# Patient Record
Sex: Female | Born: 1945 | Race: Black or African American | Hispanic: No | Marital: Married
Health system: Southern US, Community
[De-identification: ages and names within clinical notes are randomized; demographics above are authoritative.]

## PROBLEM LIST (undated history)

## (undated) HISTORY — PX: OTHER SURGICAL HISTORY: SHX169

## (undated) HISTORY — PX: BREAST SURGERY: SHX581

## (undated) HISTORY — PX: COLONOSCOPY: SHX174

---

## 2016-01-17 ENCOUNTER — Other Ambulatory Visit: Payer: Self-pay | Admitting: Family Medicine

## 2016-01-17 DIAGNOSIS — R5381 Other malaise: Secondary | ICD-10-CM

## 2016-01-24 ENCOUNTER — Other Ambulatory Visit: Payer: Self-pay | Admitting: Family Medicine

## 2016-01-24 DIAGNOSIS — R5381 Other malaise: Secondary | ICD-10-CM

## 2016-02-05 ENCOUNTER — Ambulatory Visit
Admission: RE | Admit: 2016-02-05 | Discharge: 2016-02-05 | Disposition: A | Discharge status: Home / Self Care | Payer: Medicare Other | Source: Ambulatory Visit | Attending: Family Medicine | Admitting: Family Medicine

## 2016-02-05 ENCOUNTER — Other Ambulatory Visit: Payer: Self-pay | Admitting: Family Medicine

## 2016-02-05 DIAGNOSIS — R5381 Other malaise: Secondary | ICD-10-CM

## 2016-02-05 DIAGNOSIS — N6310 Unspecified lump in the right breast, unspecified quadrant: Secondary | ICD-10-CM

## 2016-02-06 ENCOUNTER — Other Ambulatory Visit: Payer: Self-pay | Admitting: Family Medicine

## 2016-02-06 ENCOUNTER — Other Ambulatory Visit: Payer: Self-pay | Admitting: *Deleted

## 2016-02-06 DIAGNOSIS — N6320 Unspecified lump in the left breast, unspecified quadrant: Secondary | ICD-10-CM

## 2016-02-06 DIAGNOSIS — N6310 Unspecified lump in the right breast, unspecified quadrant: Secondary | ICD-10-CM

## 2016-02-13 ENCOUNTER — Other Ambulatory Visit: Payer: No Typology Code available for payment source

## 2016-02-14 ENCOUNTER — Other Ambulatory Visit: Payer: Self-pay | Admitting: Family Medicine

## 2016-02-14 DIAGNOSIS — N6320 Unspecified lump in the left breast, unspecified quadrant: Secondary | ICD-10-CM

## 2016-02-14 DIAGNOSIS — N6310 Unspecified lump in the right breast, unspecified quadrant: Secondary | ICD-10-CM

## 2016-02-15 ENCOUNTER — Ambulatory Visit
Admission: RE | Admit: 2016-02-15 | Discharge: 2016-02-15 | Disposition: A | Discharge status: Home / Self Care | Payer: Medicare Other | Source: Ambulatory Visit | Attending: Family Medicine | Admitting: Family Medicine

## 2016-02-15 DIAGNOSIS — N6310 Unspecified lump in the right breast, unspecified quadrant: Secondary | ICD-10-CM

## 2016-02-15 DIAGNOSIS — N6320 Unspecified lump in the left breast, unspecified quadrant: Secondary | ICD-10-CM

## 2018-10-20 ENCOUNTER — Emergency Department (HOSPITAL_COMMUNITY): Payer: Medicare Other

## 2018-10-20 ENCOUNTER — Encounter (HOSPITAL_COMMUNITY): Payer: Self-pay | Admitting: Emergency Medicine

## 2018-10-20 ENCOUNTER — Other Ambulatory Visit: Payer: Self-pay

## 2018-10-20 ENCOUNTER — Inpatient Hospital Stay (HOSPITAL_COMMUNITY)
Admission: EM | Admit: 2018-10-20 | Discharge: 2018-10-22 | DRG: 185 | Disposition: A | Discharge status: Home / Self Care | Payer: Medicare Other | Attending: General Surgery | Admitting: General Surgery

## 2018-10-20 DIAGNOSIS — S8001XA Contusion of right knee, initial encounter: Secondary | ICD-10-CM | POA: Diagnosis present

## 2018-10-20 DIAGNOSIS — S2249XA Multiple fractures of ribs, unspecified side, initial encounter for closed fracture: Secondary | ICD-10-CM | POA: Diagnosis present

## 2018-10-20 DIAGNOSIS — S2239XA Fracture of one rib, unspecified side, initial encounter for closed fracture: Secondary | ICD-10-CM | POA: Diagnosis present

## 2018-10-20 DIAGNOSIS — Y92410 Unspecified street and highway as the place of occurrence of the external cause: Secondary | ICD-10-CM | POA: Diagnosis not present

## 2018-10-20 DIAGNOSIS — Z23 Encounter for immunization: Secondary | ICD-10-CM

## 2018-10-20 DIAGNOSIS — S5011XA Contusion of right forearm, initial encounter: Secondary | ICD-10-CM | POA: Diagnosis present

## 2018-10-20 DIAGNOSIS — M79661 Pain in right lower leg: Secondary | ICD-10-CM | POA: Diagnosis present

## 2018-10-20 DIAGNOSIS — S80211A Abrasion, right knee, initial encounter: Secondary | ICD-10-CM | POA: Diagnosis present

## 2018-10-20 DIAGNOSIS — Z20828 Contact with and (suspected) exposure to other viral communicable diseases: Secondary | ICD-10-CM | POA: Diagnosis present

## 2018-10-20 DIAGNOSIS — S2241XA Multiple fractures of ribs, right side, initial encounter for closed fracture: Secondary | ICD-10-CM | POA: Diagnosis present

## 2018-10-20 LAB — URINALYSIS, ROUTINE W REFLEX MICROSCOPIC
Bilirubin Urine: NEGATIVE
Glucose, UA: NEGATIVE mg/dL
Hgb urine dipstick: NEGATIVE
Ketones, ur: NEGATIVE mg/dL
Leukocytes,Ua: NEGATIVE
Nitrite: NEGATIVE
Protein, ur: NEGATIVE mg/dL
Specific Gravity, Urine: 1.043 — ABNORMAL HIGH (ref 1.005–1.030)
pH: 5 (ref 5.0–8.0)

## 2018-10-20 LAB — I-STAT CHEM 8, ED
BUN: 17 mg/dL (ref 8–23)
Calcium, Ion: 0.9 mmol/L — ABNORMAL LOW (ref 1.15–1.40)
Chloride: 111 mmol/L (ref 98–111)
Creatinine, Ser: 0.8 mg/dL (ref 0.44–1.00)
Glucose, Bld: 161 mg/dL — ABNORMAL HIGH (ref 70–99)
HCT: 41 % (ref 36.0–46.0)
Hemoglobin: 13.9 g/dL (ref 12.0–15.0)
Potassium: 4.7 mmol/L (ref 3.5–5.1)
Sodium: 139 mmol/L (ref 135–145)
TCO2: 23 mmol/L (ref 22–32)

## 2018-10-20 LAB — CBC
HCT: 41.9 % (ref 36.0–46.0)
Hemoglobin: 13.1 g/dL (ref 12.0–15.0)
MCH: 28.3 pg (ref 26.0–34.0)
MCHC: 31.3 g/dL (ref 30.0–36.0)
MCV: 90.5 fL (ref 80.0–100.0)
Platelets: 169 10*3/uL (ref 150–400)
RBC: 4.63 MIL/uL (ref 3.87–5.11)
RDW: 15 % (ref 11.5–15.5)
WBC: 11.2 10*3/uL — ABNORMAL HIGH (ref 4.0–10.5)
nRBC: 0 % (ref 0.0–0.2)

## 2018-10-20 LAB — ETHANOL: Alcohol, Ethyl (B): <10 mg/dL (ref <10)

## 2018-10-20 LAB — COMPREHENSIVE METABOLIC PANEL
ALT: 19 U/L (ref 0–44)
AST: 22 U/L (ref 15–41)
Albumin: 3.6 g/dL (ref 3.5–5.0)
Alkaline Phosphatase: 60 U/L (ref 38–126)
Anion gap: 12 (ref 5–15)
BUN: 12 mg/dL (ref 8–23)
CO2: 20 mmol/L — ABNORMAL LOW (ref 22–32)
Calcium: 8.6 mg/dL — ABNORMAL LOW (ref 8.9–10.3)
Chloride: 109 mmol/L (ref 98–111)
Creatinine, Ser: 0.9 mg/dL (ref 0.44–1.00)
GFR calc Af Amer: >60 mL/min (ref >60)
GFR calc non Af Amer: >60 mL/min (ref >60)
Glucose, Bld: 168 mg/dL — ABNORMAL HIGH (ref 70–99)
Potassium: 3.8 mmol/L (ref 3.5–5.1)
Sodium: 141 mmol/L (ref 135–145)
Total Bilirubin: 1.5 mg/dL — ABNORMAL HIGH (ref 0.3–1.2)
Total Protein: 7 g/dL (ref 6.5–8.1)

## 2018-10-20 LAB — MRSA PCR SCREENING: MRSA by PCR: NEGATIVE

## 2018-10-20 LAB — CDS SEROLOGY

## 2018-10-20 LAB — LACTIC ACID, PLASMA
Lactic Acid, Venous: 2.3 mmol/L (ref 0.5–1.9)
Lactic Acid, Venous: 1.8 mmol/L (ref 0.5–1.9)

## 2018-10-20 LAB — SAMPLE TO BLOOD BANK

## 2018-10-20 LAB — PROTIME-INR
INR: 1 (ref 0.8–1.2)
Prothrombin Time: 12.6 seconds (ref 11.4–15.2)

## 2018-10-20 MED ORDER — TETANUS-DIPHTH-ACELL PERTUSSIS 5-2.5-18.5 LF-MCG/0.5 IM SUSP
0.5000 mL | Freq: Once | INTRAMUSCULAR | Status: AC
  Administered 2018-10-20: 15:00:00 0.5 mL via INTRAMUSCULAR
  Filled 2018-10-20: qty 0.5

## 2018-10-20 MED ORDER — SODIUM CHLORIDE 0.9 % IV BOLUS
1000.0000 mL | Freq: Once | INTRAVENOUS | Status: AC
  Administered 2018-10-20: 1000 mL via INTRAVENOUS

## 2018-10-20 MED ORDER — MORPHINE SULFATE (PF) 2 MG/ML IV SOLN
2.0000 mg | INTRAVENOUS | Status: DC | PRN
  Administered 2018-10-20: 2 mg via INTRAVENOUS
  Filled 2018-10-20: qty 1

## 2018-10-20 MED ORDER — IOHEXOL 300 MG/ML  SOLN
100.0000 mL | Freq: Once | INTRAMUSCULAR | Status: AC | PRN
  Administered 2018-10-20: 15:00:00 100 mL via INTRAVENOUS

## 2018-10-20 MED ORDER — DOCUSATE SODIUM 100 MG PO CAPS
100.0000 mg | ORAL_CAPSULE | Freq: Two times a day (BID) | ORAL | Status: DC
  Administered 2018-10-20 – 2018-10-22 (×4): 100 mg via ORAL
  Filled 2018-10-20 (×4): qty 1

## 2018-10-20 MED ORDER — IBUPROFEN 200 MG PO TABS
800.0000 mg | ORAL_TABLET | Freq: Three times a day (TID) | ORAL | Status: DC | PRN
  Administered 2018-10-20 – 2018-10-21 (×2): 800 mg via ORAL
  Filled 2018-10-20 (×2): qty 4

## 2018-10-20 MED ORDER — ENOXAPARIN SODIUM 40 MG/0.4ML ~~LOC~~ SOLN
40.0000 mg | SUBCUTANEOUS | Status: DC
  Administered 2018-10-20 – 2018-10-21 (×2): 40 mg via SUBCUTANEOUS
  Filled 2018-10-20 (×2): qty 0.4

## 2018-10-20 MED ORDER — ONDANSETRON HCL 4 MG/2ML IJ SOLN
4.0000 mg | Freq: Once | INTRAMUSCULAR | Status: AC
  Administered 2018-10-20: 15:00:00 4 mg via INTRAVENOUS
  Filled 2018-10-20: qty 2

## 2018-10-20 MED ORDER — ACETAMINOPHEN 325 MG PO TABS: 650.0000 mg | ORAL_TABLET | ORAL | Status: DC | PRN

## 2018-10-20 MED ORDER — FENTANYL CITRATE (PF) 100 MCG/2ML IJ SOLN
100.0000 ug | Freq: Once | INTRAMUSCULAR | Status: AC
  Administered 2018-10-20: 15:00:00 100 ug via INTRAVENOUS
  Filled 2018-10-20: qty 2
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
## 2018-10-21 ENCOUNTER — Inpatient Hospital Stay (HOSPITAL_COMMUNITY): Payer: Medicare Other

## 2018-10-21 LAB — CBC
HCT: 39.7 % (ref 36.0–46.0)
Hemoglobin: 12.5 g/dL (ref 12.0–15.0)
MCH: 27.7 pg (ref 26.0–34.0)
MCHC: 31.5 g/dL (ref 30.0–36.0)
MCV: 88 fL (ref 80.0–100.0)
Platelets: 132 10*3/uL — ABNORMAL LOW (ref 150–400)
RBC: 4.51 MIL/uL (ref 3.87–5.11)
RDW: 15 % (ref 11.5–15.5)
WBC: 4.7 10*3/uL (ref 4.0–10.5)
nRBC: 0 % (ref 0.0–0.2)

## 2018-10-21 LAB — BASIC METABOLIC PANEL
Anion gap: 10 (ref 5–15)
BUN: 12 mg/dL (ref 8–23)
CO2: 23 mmol/L (ref 22–32)
Calcium: 8.3 mg/dL — ABNORMAL LOW (ref 8.9–10.3)
Chloride: 107 mmol/L (ref 98–111)
Creatinine, Ser: 0.85 mg/dL (ref 0.44–1.00)
GFR calc Af Amer: >60 mL/min (ref >60)
GFR calc non Af Amer: >60 mL/min (ref >60)
Glucose, Bld: 135 mg/dL — ABNORMAL HIGH (ref 70–99)
Potassium: 3.8 mmol/L (ref 3.5–5.1)
Sodium: 140 mmol/L (ref 135–145)

## 2018-10-21 LAB — SARS CORONAVIRUS 2 (TAT 6-24 HRS): SARS Coronavirus 2: NEGATIVE

## 2018-10-21 MED ORDER — METHOCARBAMOL 500 MG PO TABS: 500.0000 mg | ORAL_TABLET | Freq: Three times a day (TID) | ORAL | Status: DC | PRN

## 2018-10-21 MED ORDER — ACETAMINOPHEN 325 MG PO TABS
650.0000 mg | ORAL_TABLET | ORAL | Status: DC
  Administered 2018-10-21 – 2018-10-22 (×5): 650 mg via ORAL
  Filled 2018-10-21 (×5): qty 2

## 2018-10-21 MED ORDER — BACITRACIN ZINC 500 UNIT/GM EX OINT
TOPICAL_OINTMENT | Freq: Two times a day (BID) | CUTANEOUS | Status: DC
  Administered 2018-10-21: 1 via TOPICAL
  Administered 2018-10-22: 09:00:00 via TOPICAL
  Filled 2018-10-21: qty 28.4

## 2018-10-21 MED ORDER — OXYCODONE HCL 5 MG PO TABS: 5.0000 mg | ORAL_TABLET | ORAL | Status: DC | PRN
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
## 2018-10-21 NOTE — Progress Notes (Signed)
Visited patient for Spiritual Care Consult. Patient requested prayer for strength and healing. Patient is very spiritually resilient and leans on personal faith. Will continue to provide spiritual care as needed.  Rev. [place].

## 2018-10-22 MED ORDER — OXYCODONE HCL 5 MG PO TABS: 5.0000 mg | ORAL_TABLET | ORAL | 0 refills | Status: AC | PRN

## 2018-10-22 MED ORDER — IBUPROFEN 800 MG PO TABS: 800.0000 mg | ORAL_TABLET | Freq: Three times a day (TID) | ORAL | 0 refills | Status: AC | PRN

## 2018-10-22 MED ORDER — ACETAMINOPHEN 325 MG PO TABS: 650.0000 mg | ORAL_TABLET | ORAL | Status: AC

## 2018-10-22 NOTE — Progress Notes (Signed)
Discharge paperwork and instructions given to pt. Pt is not in distress and tolerated well.

## 2018-10-22 NOTE — Care Management Important Message (Signed)
Important Message  Patient Details  Name: Paige Ruiz MRN: [id number] Date of Birth: Oct 12, 1945   Medicare Important Message Given:  Yes     Orbie Pyo 10/22/2018, 2:57 PM

## 2018-10-22 NOTE — Plan of Care (Signed)
  Problem: Pain Managment: Goal: General experience of comfort will improve Outcome: Progressing   Problem: Safety: Goal: Ability to remain free from injury will improve Outcome: Progressing   
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
## 2018-10-22 NOTE — Discharge Instructions (Signed)
Rib Fracture ° °A rib fracture is a break or crack in one of the bones of the ribs. The ribs are like a cage that goes around your upper chest. A broken or cracked rib is often painful, but most do not cause other problems. Most rib fractures usually heal on their own in 1-3 months. °Follow these instructions at home: °Managing pain, stiffness, and swelling °· If directed, apply ice to the injured area. °? Put ice in a plastic bag. °? Place a towel between your skin and the bag. °? Leave the ice on for 20 minutes, 2-3 times a day. °· Take over-the-counter and prescription medicines only as told by your doctor. °Activity °· Avoid activities that cause pain to the injured area. Protect your injured area. °· Slowly increase activity as told by your doctor. °General instructions °· Do deep breathing as told by your doctor. You may be told to: °? Take deep breaths many times a day. °? Cough many times a day while hugging a pillow. °? Use a device (incentive spirometer) to do deep breathing many times a day. °· Drink enough fluid to keep your pee (urine) clear or pale yellow. °· Do not wear a rib belt or binder. These do not allow you to breathe deeply. °· Keep all follow-up visits as told by your doctor. This is important. °Contact a doctor if: °· You have a fever. °Get help right away if: °· You have trouble breathing. °· You are short of breath. °· You cannot stop coughing. °· You cough up thick or bloody spit (sputum). °· You feel sick to your stomach (nauseous), throw up (vomit), or have belly (abdominal) pain. °· Your pain gets worse and medicine does not help. °Summary °· A rib fracture is a break or crack in one of the bones of the ribs. °· Apply ice to the injured area and take medicines for pain as told by your doctor. °· Take deep breaths and cough many times a day. Hug a pillow every time you cough. °This information is not intended to replace advice given to you by your health care provider. Make sure you  discuss any questions you have with your health care provider. °Document Released: 10/09/2007 Document Revised: 12/12/2016 Document Reviewed: 04/01/2016 °Elsevier Patient Education © 2020 [employer] ° °
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
## 2018-10-26 ENCOUNTER — Other Ambulatory Visit: Payer: Self-pay

## 2018-10-26 ENCOUNTER — Encounter (HOSPITAL_COMMUNITY): Payer: Self-pay | Admitting: Emergency Medicine

## 2018-10-26 ENCOUNTER — Emergency Department (HOSPITAL_COMMUNITY)
Admission: EM | Admit: 2018-10-26 | Discharge: 2018-10-27 | Disposition: A | Discharge status: Home / Self Care | Payer: Medicare Other | Attending: Emergency Medicine | Admitting: Emergency Medicine

## 2018-10-26 DIAGNOSIS — Z79899 Other long term (current) drug therapy: Secondary | ICD-10-CM | POA: Insufficient documentation

## 2018-10-26 DIAGNOSIS — R58 Hemorrhage, not elsewhere classified: Secondary | ICD-10-CM | POA: Insufficient documentation

## 2018-10-26 DIAGNOSIS — R2241 Localized swelling, mass and lump, right lower limb: Secondary | ICD-10-CM | POA: Diagnosis present

## 2018-10-26 DIAGNOSIS — Y929 Unspecified place or not applicable: Secondary | ICD-10-CM | POA: Insufficient documentation

## 2018-10-26 DIAGNOSIS — S8981XD Other specified injuries of right lower leg, subsequent encounter: Secondary | ICD-10-CM | POA: Diagnosis not present

## 2018-10-26 DIAGNOSIS — R52 Pain, unspecified: Secondary | ICD-10-CM

## 2018-10-26 DIAGNOSIS — Y999 Unspecified external cause status: Secondary | ICD-10-CM | POA: Diagnosis not present

## 2018-10-26 DIAGNOSIS — M79604 Pain in right leg: Secondary | ICD-10-CM | POA: Diagnosis not present

## 2018-10-26 DIAGNOSIS — Y939 Activity, unspecified: Secondary | ICD-10-CM | POA: Insufficient documentation

## 2018-10-26 DIAGNOSIS — S70321A Blister (nonthermal), right thigh, initial encounter: Secondary | ICD-10-CM | POA: Diagnosis not present

## 2018-10-26 LAB — CBC WITH DIFFERENTIAL/PLATELET
Abs Immature Granulocytes: 0.04 10*3/uL (ref 0.00–0.07)
Basophils Absolute: 0 10*3/uL (ref 0.0–0.1)
Basophils Relative: 0 %
Eosinophils Absolute: 0.1 10*3/uL (ref 0.0–0.5)
Eosinophils Relative: 1 %
HCT: 35 % — ABNORMAL LOW (ref 36.0–46.0)
Hemoglobin: 11.3 g/dL — ABNORMAL LOW (ref 12.0–15.0)
Immature Granulocytes: 1 %
Lymphocytes Relative: 16 %
Lymphs Abs: 1.2 10*3/uL (ref 0.7–4.0)
MCH: 28.7 pg (ref 26.0–34.0)
MCHC: 32.3 g/dL (ref 30.0–36.0)
MCV: 88.8 fL (ref 80.0–100.0)
Monocytes Absolute: 0.6 10*3/uL (ref 0.1–1.0)
Monocytes Relative: 9 %
Neutro Abs: 5.4 10*3/uL (ref 1.7–7.7)
Neutrophils Relative %: 73 %
Platelets: 204 10*3/uL (ref 150–400)
RBC: 3.94 MIL/uL (ref 3.87–5.11)
RDW: 15.1 % (ref 11.5–15.5)
WBC: 7.4 10*3/uL (ref 4.0–10.5)
nRBC: 0 % (ref 0.0–0.2)

## 2018-10-26 LAB — COMPREHENSIVE METABOLIC PANEL
ALT: 32 U/L (ref 0–44)
AST: 24 U/L (ref 15–41)
Albumin: 3.9 g/dL (ref 3.5–5.0)
Alkaline Phosphatase: 77 U/L (ref 38–126)
Anion gap: 13 (ref 5–15)
BUN: 13 mg/dL (ref 8–23)
CO2: 20 mmol/L — ABNORMAL LOW (ref 22–32)
Calcium: 9.1 mg/dL (ref 8.9–10.3)
Chloride: 107 mmol/L (ref 98–111)
Creatinine, Ser: 0.85 mg/dL (ref 0.44–1.00)
GFR calc Af Amer: >60 mL/min (ref >60)
GFR calc non Af Amer: >60 mL/min (ref >60)
Glucose, Bld: 112 mg/dL — ABNORMAL HIGH (ref 70–99)
Potassium: 3.5 mmol/L (ref 3.5–5.1)
Sodium: 140 mmol/L (ref 135–145)
Total Bilirubin: 2.6 mg/dL — ABNORMAL HIGH (ref 0.3–1.2)
Total Protein: 7.6 g/dL (ref 6.5–8.1)

## 2018-10-26 LAB — PROTIME-INR
INR: 1.1 (ref 0.8–1.2)
Prothrombin Time: 14.4 seconds (ref 11.4–15.2)

## 2018-10-26 NOTE — ED Triage Notes (Signed)
Patient reports worsening right leg swelling " feels tight" with skin blisters at upper thigh this week worse today , denies injury , seen at a local urgent care today sent here for further evaluation of DVT , respirations unlabored, faint pedal pulses. Denies fever or chills .

## 2018-10-27 ENCOUNTER — Emergency Department (HOSPITAL_COMMUNITY): Payer: Medicare Other

## 2018-10-27 ENCOUNTER — Emergency Department (HOSPITAL_BASED_OUTPATIENT_CLINIC_OR_DEPARTMENT_OTHER): Payer: Medicare Other

## 2018-10-27 DIAGNOSIS — M7989 Other specified soft tissue disorders: Secondary | ICD-10-CM | POA: Diagnosis not present

## 2018-10-27 DIAGNOSIS — M79609 Pain in unspecified limb: Secondary | ICD-10-CM

## 2018-10-27 DIAGNOSIS — M79604 Pain in right leg: Secondary | ICD-10-CM | POA: Diagnosis not present

## 2018-10-27 MED ORDER — ACETAMINOPHEN 325 MG PO TABS
650.0000 mg | ORAL_TABLET | Freq: Once | ORAL | Status: AC
  Administered 2018-10-27: 07:00:00 650 mg via ORAL
  Filled 2018-10-27: qty 2

## 2018-10-27 MED ORDER — SILVER SULFADIAZINE 1 % EX CREA: 1.0000 "application " | TOPICAL_CREAM | Freq: Every day | CUTANEOUS | 0 refills | Status: AC

## 2018-10-27 MED ORDER — IOHEXOL 300 MG/ML  SOLN
100.0000 mL | Freq: Once | INTRAMUSCULAR | Status: AC | PRN
  Administered 2018-10-27: 100 mL via INTRAVENOUS

## 2018-10-27 MED ORDER — SILVER SULFADIAZINE 1 % EX CREA
TOPICAL_CREAM | Freq: Once | CUTANEOUS | Status: AC
  Administered 2018-10-27: 16:00:00 via TOPICAL
  Filled 2018-10-27: qty 85

## 2018-10-27 NOTE — Discharge Instructions (Addendum)
Please follow up with Dr. Doreatha Martin in the office Wear the ACE wrap to help with swelling and elevate the leg. You can obtain compression stockings (TED hose) over the counter. Apply Silvadene cream to the wounds to help with healing. Apply a thin layer and cover with a bandage. Wash the wounds with soap and water daily. Please follow up with your primary doctor for a wound check in 1-2 weeks Continue your pain medicine as prescribed Return if worsening
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
## 2018-10-27 NOTE — ED Notes (Signed)
Vascular Tech @ bedside.

## 2018-10-27 NOTE — ED Notes (Signed)
Patient transported to CT 
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
## 2018-10-27 NOTE — Progress Notes (Signed)
Right lower ext venous  has been completed. Refer to [facility] under chart review to view preliminary results.   10/27/2018  10:03 AM Keirsten Matuska, Bonnye Fava
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
## 2018-10-28 ENCOUNTER — Encounter: Payer: Self-pay | Admitting: Student

## 2018-10-28 DIAGNOSIS — T148XXA Other injury of unspecified body region, initial encounter: Secondary | ICD-10-CM | POA: Insufficient documentation

## 2018-11-23 ENCOUNTER — Other Ambulatory Visit (HOSPITAL_COMMUNITY): Payer: Self-pay | Admitting: Student

## 2018-11-23 ENCOUNTER — Other Ambulatory Visit: Payer: Self-pay | Admitting: Student

## 2018-11-23 ENCOUNTER — Other Ambulatory Visit: Payer: Self-pay

## 2018-11-23 ENCOUNTER — Ambulatory Visit (HOSPITAL_COMMUNITY)
Admission: RE | Admit: 2018-11-23 | Discharge: 2018-11-23 | Disposition: A | Discharge status: Home / Self Care | Payer: Medicare Other | Source: Ambulatory Visit | Attending: Family Medicine | Admitting: Family Medicine

## 2018-11-23 DIAGNOSIS — M79604 Pain in right leg: Secondary | ICD-10-CM | POA: Diagnosis not present

## 2018-11-23 DIAGNOSIS — M7989 Other specified soft tissue disorders: Secondary | ICD-10-CM

## 2018-11-23 NOTE — Progress Notes (Signed)
Right lower extremity venous duplex has been completed. Preliminary results can be found in CV Proc through chart review.  Results were given to Dr. Doreatha Martin.  11/23/18 1:25 PM Paige Ruiz RVT

## 2019-01-04 ENCOUNTER — Other Ambulatory Visit (HOSPITAL_COMMUNITY): Payer: Self-pay | Admitting: Student

## 2019-01-04 ENCOUNTER — Ambulatory Visit (HOSPITAL_COMMUNITY)
Admission: RE | Admit: 2019-01-04 | Discharge: 2019-01-04 | Disposition: A | Discharge status: Home / Self Care | Payer: Medicare Other | Source: Ambulatory Visit | Attending: Student | Admitting: Student

## 2019-01-04 ENCOUNTER — Other Ambulatory Visit: Payer: Self-pay

## 2019-01-04 DIAGNOSIS — M7989 Other specified soft tissue disorders: Secondary | ICD-10-CM

## 2019-01-04 DIAGNOSIS — M79604 Pain in right leg: Secondary | ICD-10-CM | POA: Diagnosis present

## 2019-01-04 NOTE — Progress Notes (Signed)
Lower extremity venous has been completed.   Preliminary results in CV Proc.   Paige Ruiz 01/04/2019 2:00 PM

## 2019-03-13 ENCOUNTER — Emergency Department (HOSPITAL_COMMUNITY)
Admission: EM | Admit: 2019-03-13 | Discharge: 2019-03-13 | Disposition: A | Discharge status: Home / Self Care | Payer: Medicare Other | Attending: Emergency Medicine | Admitting: Emergency Medicine

## 2019-03-13 ENCOUNTER — Encounter (HOSPITAL_COMMUNITY): Payer: Self-pay

## 2019-03-13 ENCOUNTER — Other Ambulatory Visit: Payer: Self-pay

## 2019-03-13 DIAGNOSIS — S71101D Unspecified open wound, right thigh, subsequent encounter: Secondary | ICD-10-CM | POA: Insufficient documentation

## 2019-03-13 DIAGNOSIS — D649 Anemia, unspecified: Secondary | ICD-10-CM | POA: Insufficient documentation

## 2019-03-13 DIAGNOSIS — Z5189 Encounter for other specified aftercare: Secondary | ICD-10-CM | POA: Diagnosis not present

## 2019-03-13 DIAGNOSIS — X58XXXD Exposure to other specified factors, subsequent encounter: Secondary | ICD-10-CM | POA: Diagnosis not present

## 2019-03-13 LAB — CBC WITH DIFFERENTIAL/PLATELET
Abs Immature Granulocytes: 0.06 10*3/uL (ref 0.00–0.07)
Basophils Absolute: 0 10*3/uL (ref 0.0–0.1)
Basophils Relative: 0 %
Eosinophils Absolute: 0 10*3/uL (ref 0.0–0.5)
Eosinophils Relative: 0 %
HCT: 31.5 % — ABNORMAL LOW (ref 36.0–46.0)
Hemoglobin: 9.8 g/dL — ABNORMAL LOW (ref 12.0–15.0)
Immature Granulocytes: 1 %
Lymphocytes Relative: 10 %
Lymphs Abs: 1 10*3/uL (ref 0.7–4.0)
MCH: 25.4 pg — ABNORMAL LOW (ref 26.0–34.0)
MCHC: 31.1 g/dL (ref 30.0–36.0)
MCV: 81.6 fL (ref 80.0–100.0)
Monocytes Absolute: 1.1 10*3/uL — ABNORMAL HIGH (ref 0.1–1.0)
Monocytes Relative: 12 %
Neutro Abs: 7.5 10*3/uL (ref 1.7–7.7)
Neutrophils Relative %: 77 %
Platelets: 315 10*3/uL (ref 150–400)
RBC: 3.86 MIL/uL — ABNORMAL LOW (ref 3.87–5.11)
RDW: 15.9 % — ABNORMAL HIGH (ref 11.5–15.5)
WBC: 9.8 10*3/uL (ref 4.0–10.5)
nRBC: 0 % (ref 0.0–0.2)

## 2019-03-13 LAB — BASIC METABOLIC PANEL
Anion gap: 14 (ref 5–15)
BUN: 15 mg/dL (ref 8–23)
CO2: 20 mmol/L — ABNORMAL LOW (ref 22–32)
Calcium: 8.4 mg/dL — ABNORMAL LOW (ref 8.9–10.3)
Chloride: 102 mmol/L (ref 98–111)
Creatinine, Ser: 0.95 mg/dL (ref 0.44–1.00)
GFR calc Af Amer: >60 mL/min (ref >60)
GFR calc non Af Amer: 59 mL/min — ABNORMAL LOW (ref >60)
Glucose, Bld: 101 mg/dL — ABNORMAL HIGH (ref 70–99)
Potassium: 3.9 mmol/L (ref 3.5–5.1)
Sodium: 136 mmol/L (ref 135–145)

## 2019-03-13 NOTE — ED Provider Notes (Signed)
[facility] EMERGENCY DEPARTMENT Provider Note   CSN: [id number] Arrival date & time: 03/13/19  1854     History Chief Complaint  Patient presents with  . Wound Check  . Leg Swelling    Paige Ruiz is a 74 y.o. female with a past medical history of colon polyps, hyperlipidemia, Paige Ruiz lesion of the right thigh, who presents today for evaluation of bleeding from her thigh wound. She reports that she is followed by the wound care clinic in [facility] and that when she saw them on Wednesday the performed debridement of the overlying scab and tissue and since then she has noticed occasional bleeding.  Today when she was washing the wound however she states that it began squirting a thin string of blood across the room causing her to be concerned and called 911.  She states that there is some redness inside the wound itself, however no significant new redness around the wound.  She denies any change in her pain.  She states that the wound looks similar to how it has looked.  No fevers at home.    She denies any concerns today other than the bleeding.  She briefly attempted pressure prior to calling 911.  She states she is not currently taking any anticoagulation medicines.  HPI     History reviewed. No pertinent past medical history.  Patient Active Problem List   Diagnosis Date Noted  . Norma Fredrickson lesion 10/28/2018  . Rib fractures 10/20/2018    Past Surgical History:  Procedure Laterality Date  . BREAST SURGERY    . COLONOSCOPY    . eye sty       OB History   No obstetric history on file.     No family history on file.  Social History   Tobacco Use  . Smoking status: Never Smoker  . Smokeless tobacco: Never Used  Substance Use Topics  . Alcohol use: Yes    Comment: occ  . Drug use: Never    Home Medications Prior to Admission medications   Medication Sig Start Date End Date Taking? Authorizing Provider  DAKINS EX Apply 1  application topically daily. To cleanse wound on right thigh   Yes [provider]  Multiple Vitamin (MULTIVITAMIN WITH MINERALS) TABS tablet Take 1 tablet by mouth daily.   Yes [provider]  acetaminophen (TYLENOL) 325 MG tablet Take 2 tablets (650 mg total) by mouth every 4 (four) hours. Patient not taking: Reported on 03/13/2019 10/22/18   Barnetta Chapel, PA-C  ibuprofen (ADVIL) 800 MG tablet Take 1 tablet (800 mg total) by mouth every 8 (eight) hours as needed for moderate pain. Patient not taking: Reported on 03/13/2019 10/22/18   Barnetta Chapel, PA-C  oxyCODONE (OXY IR/ROXICODONE) 5 MG immediate release tablet Take 1-2 tablets (5-10 mg total) by mouth every 4 (four) hours as needed for moderate pain. Patient not taking: Reported on 03/13/2019 10/22/18   Barnetta Chapel, PA-C  silver sulfADIAZINE (SILVADENE) 1 % cream Apply 1 application topically daily. Patient not taking: Reported on 03/13/2019 10/27/18   Bethel Born, PA-C    Allergies    Patient has no known allergies.  Review of Systems   Review of Systems  Constitutional: Negative for chills and fever.  HENT: Negative for congestion.   Respiratory: Negative for chest tightness and shortness of breath.   Cardiovascular: Negative for chest pain.  Gastrointestinal: Negative for abdominal pain.  Genitourinary: Negative for dysuria and pelvic pain.  Musculoskeletal:  Negative for back pain.  Skin: Positive for wound.  All other systems reviewed and are negative.   Physical Exam Updated Vital Signs BP (!) 107/57   Pulse 81   Temp 99.1 F (37.3 C) (Oral)   Resp (!) 21   Ht 5\' 5"  (1.651 m)   Wt 83.9 kg   SpO2 97%   BMI 30.79 kg/m   Physical Exam Vitals and nursing note reviewed.  Constitutional:      General: She is not in acute distress.    Appearance: She is well-developed. She is not diaphoretic.  HENT:     Head: Normocephalic and atraumatic.  Eyes:     General: No scleral icterus.       Right  eye: No discharge.        Left eye: No discharge.     Conjunctiva/sclera: Conjunctivae normal.  Cardiovascular:     Rate and Rhythm: Normal rate and regular rhythm.     Comments: 2+ dp pulse on left leg.  Pulmonary:     Effort: Pulmonary effort is normal. No respiratory distress.     Breath sounds: No stridor.  Abdominal:     General: There is no distension.  Musculoskeletal:        General: No deformity.     Cervical back: Normal range of motion.  Skin:    General: Skin is warm and dry.     Comments: Please see clinical image.  Right medial thigh has a wound approximately 8 cm x 6 cm with scab/clot present in the middle with surrounding beefy red granulation tissue and spares areas of fibrin. There is no abnormal periwound erythema, or warmth/induration.  Neurological:     Mental Status: She is alert.     Motor: No abnormal muscle tone.     Comments: Sensation   Psychiatric:        Behavior: Behavior normal.         ED Results / Procedures / Treatments   Labs (all labs ordered are listed, but only abnormal results are displayed) Labs Reviewed  CBC WITH DIFFERENTIAL/PLATELET - Abnormal; Notable for the following components:      Result Value   RBC 3.86 (*)    Hemoglobin 9.8 (*)    HCT 31.5 (*)    MCH 25.4 (*)    RDW 15.9 (*)    Monocytes Absolute 1.1 (*)    All other components within normal limits  BASIC METABOLIC PANEL - Abnormal; Notable for the following components:   CO2 20 (*)    Glucose, Bld 101 (*)    Calcium 8.4 (*)    GFR calc non Af Amer 59 (*)    All other components within normal limits    EKG EKG Interpretation  Date/Time:  Sunday March 13 2019 19:15:16 EST Ventricular Rate:  97 PR Interval:    QRS Duration: 99 QT Interval:  363 QTC Calculation: 462 R Axis:   -43 Text Interpretation: Sinus rhythm Borderline short PR interval Left anterior fascicular block Abnormal R-wave progression, early transition Artifact Abnormal ECG Confirmed by  Carmin Muskrat [phone]) on 03/13/2019 7:39:19 PM   Radiology No results found.  Procedures Procedures (including critical care time)  Medications Ordered in ED Medications - No data to display  ED Course  I have reviewed the triage vital signs and the nursing notes.  Pertinent labs & imaging results that were available during my care of the patient were reviewed by me and considered in my medical decision making (  see chart for details).    MDM Rules/Calculators/A&P                     Patient is a 74 year old woman who presents today for evaluation of a wound check. She has a chronic wound on her right medial thigh that began bleeding today while she was performing wound care.  Recently at the wound care Clinic they removed superficial scabs and debrided the wound at that point it was bleeding and was controlled with pressure. Her only concern today is the wound bleeding. On my exam she does not have any active bleeding.  We discussed appropriate methods of bleeding control.  Patient is not tachycardic or tachypneic.  She denies any fevers and does not have any evidence of acute surrounding infection, states the wound looks like it normally does.  Labs are obtained and reviewed, she does have mild anemia, however her comparison labs are from over 4 months ago so unsure if this is a acute process. Even if this is acute process she appears stable with no tachycardia or tachypnea. Recommended close outpatient follow-up with repeat labs drawn in the next 3 to 4 days along with wound clinic follow-up.  This patient was seen as a shared visit with Dr. Jeraldine Loots.  Recommended close outpatient follow-up.  Return precautions were discussed with patient who states their understanding.  At the time of discharge patient denied any unaddressed complaints or concerns.  Patient is agreeable for discharge home.  Note: Portions of this report may have been transcribed using voice recognition  software. Every effort was made to ensure accuracy; however, inadvertent computerized transcription errors may be present   Final Clinical Impression(s) / ED Diagnoses Final diagnoses:  Visit for wound check  Anemia, unspecified type    Rx / DC Orders ED Discharge Orders    None       Norman Clay 03/13/19 2309    Gerhard Munch, MD 03/14/19 [phone]

## 2019-03-13 NOTE — Discharge Instructions (Addendum)
Today your hemoglobin was slightly low, however comparison was 4 months ago. Your hemoglobin is not low enough to require admission at this time. Please get your blood work rechecked in the next 3 to 4 days. Please schedule a follow-up appointment with your primary care doctor and the wound care team. As we discussed if you develop bleeding previous apply firm pressure.  At least 5 minutes.  Do not take the first layer/dressing off during this time to look at the wound as removing it will pull off any scab.

## 2019-03-13 NOTE — ED Notes (Signed)
Applied a non-adherent dreesing, ABD pad, and kerlex to pt's wound.

## 2019-03-13 NOTE — Progress Notes (Signed)
Pt requests to speak with MD prior to IV placement. Bedside RN notified.  Gilman Schmidt, RN VAST
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
## 2019-03-13 NOTE — ED Notes (Signed)
Hey pt is refusing IV team, can you put in an order?

## 2019-03-13 NOTE — ED Notes (Signed)
Pt has 2+ edema of right leg, 2+ right pedal pulse, cap refill less than 3 sec, warm to touch, pt able to wiggle toes.
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
## 2021-03-05 IMAGING — DX DG KNEE COMPLETE 4+V*R*
4 series · 4 of 4 positions shown · non-contrast
Comparison: None.

CLINICAL DATA: Status post fall, knee pain

EXAM:
RIGHT KNEE - COMPLETE 4+ VIEW

[knee ap]
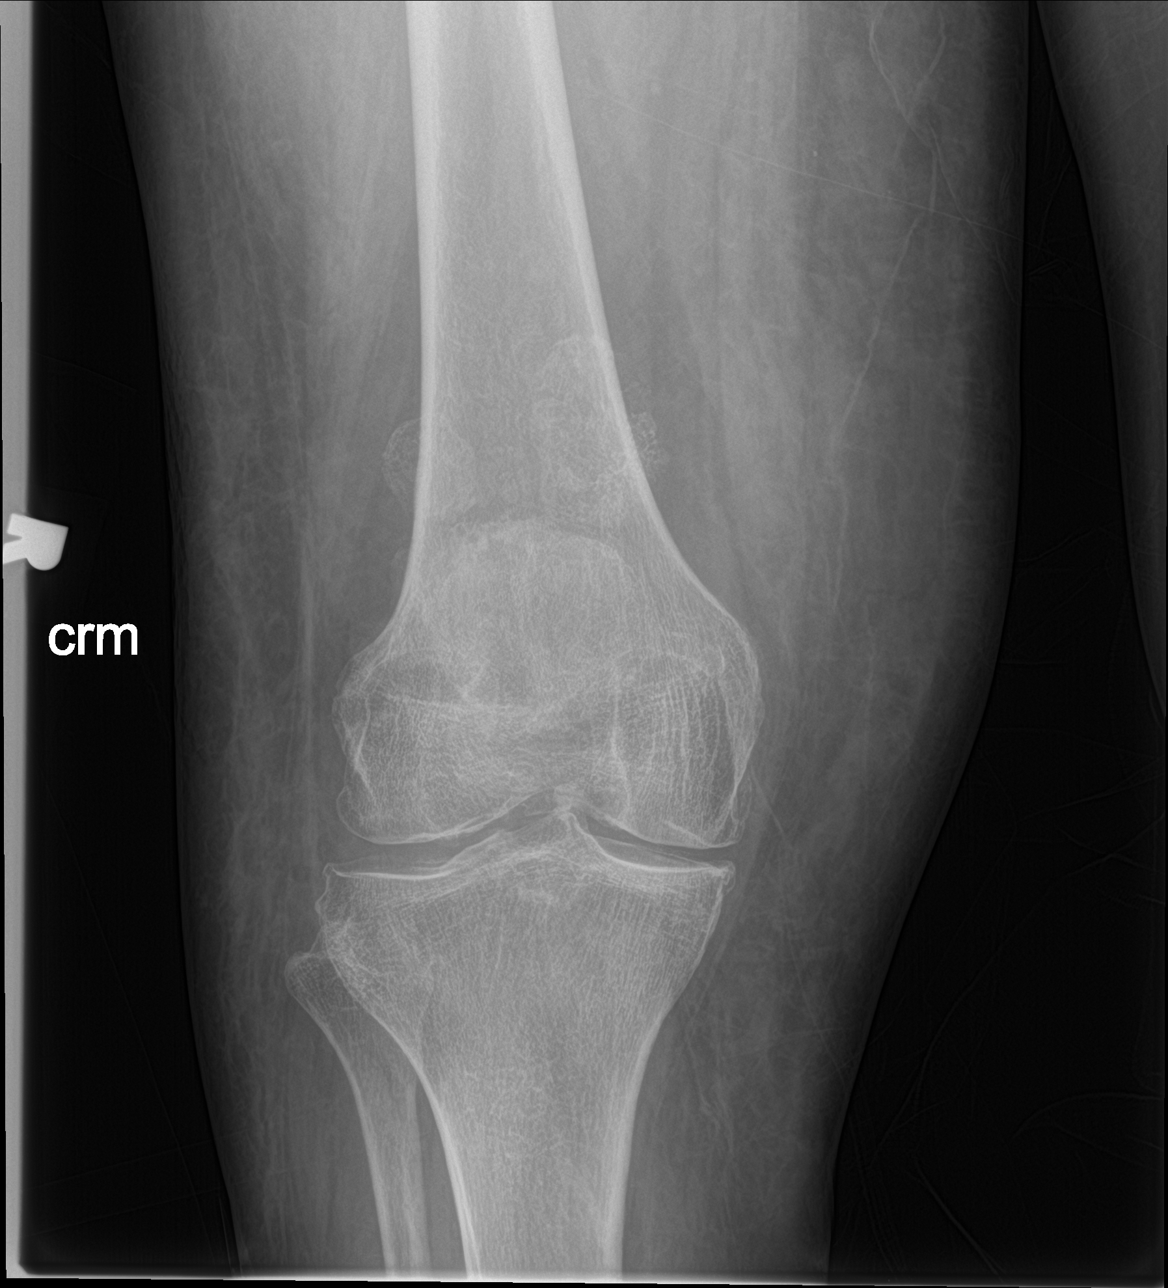

[knee lat]
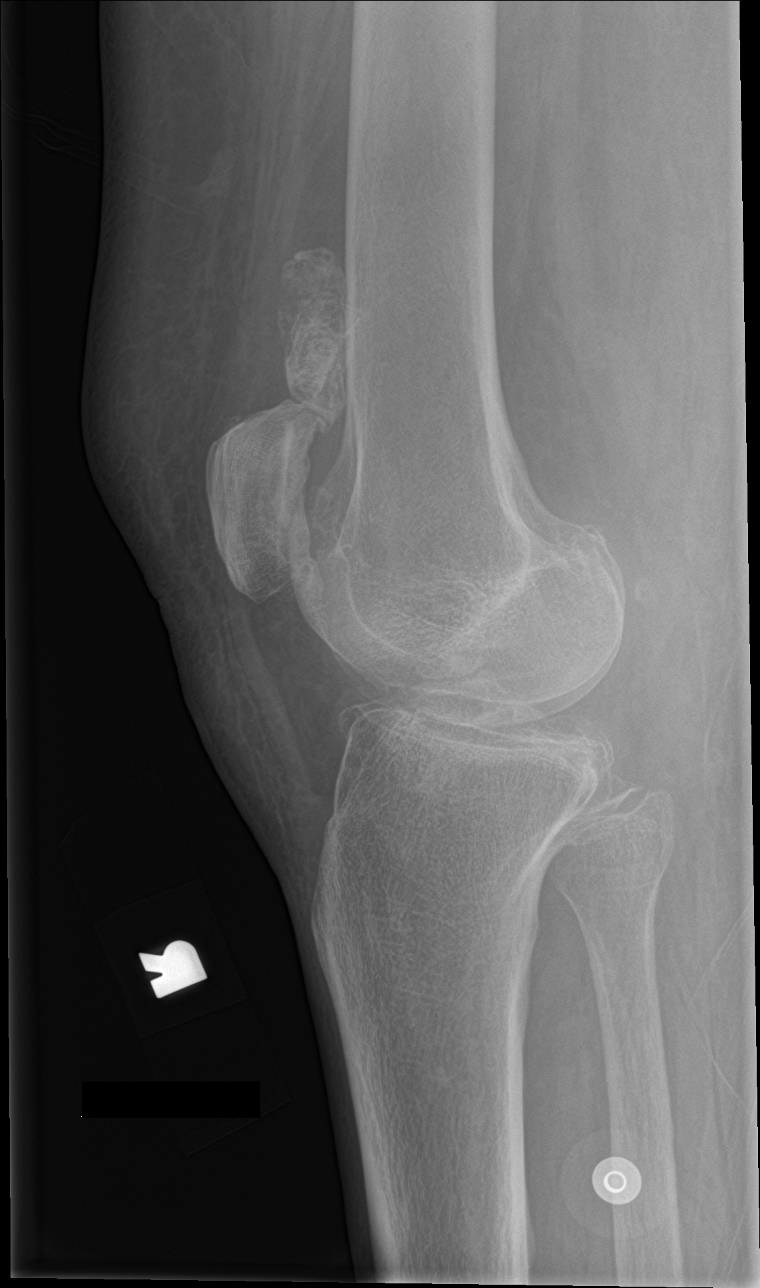

[knee obl (1 of 2)]
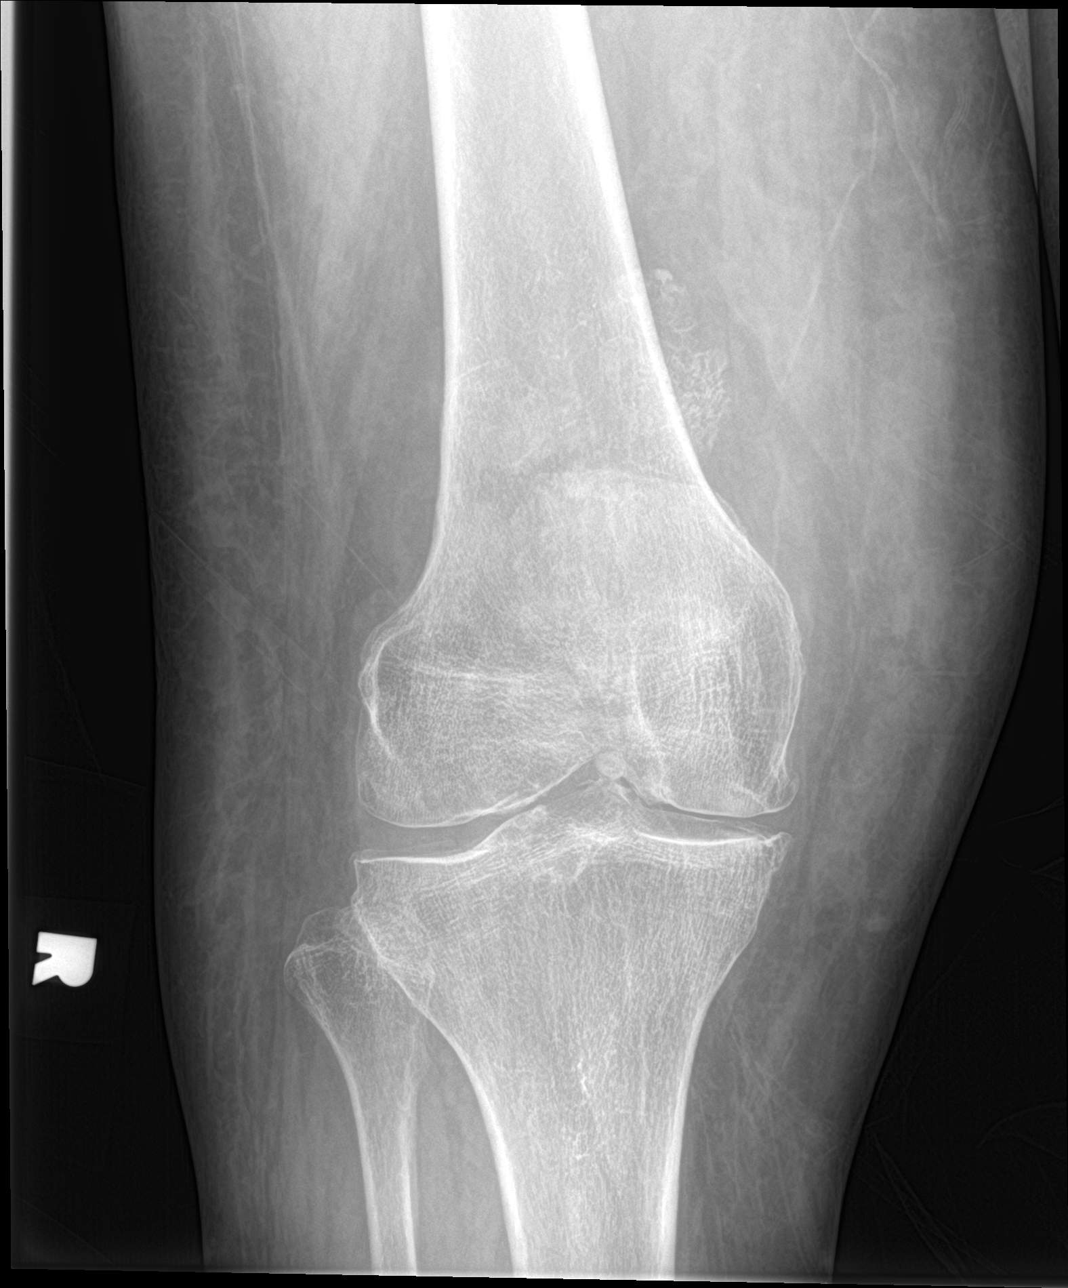

[knee obl (2 of 2)]
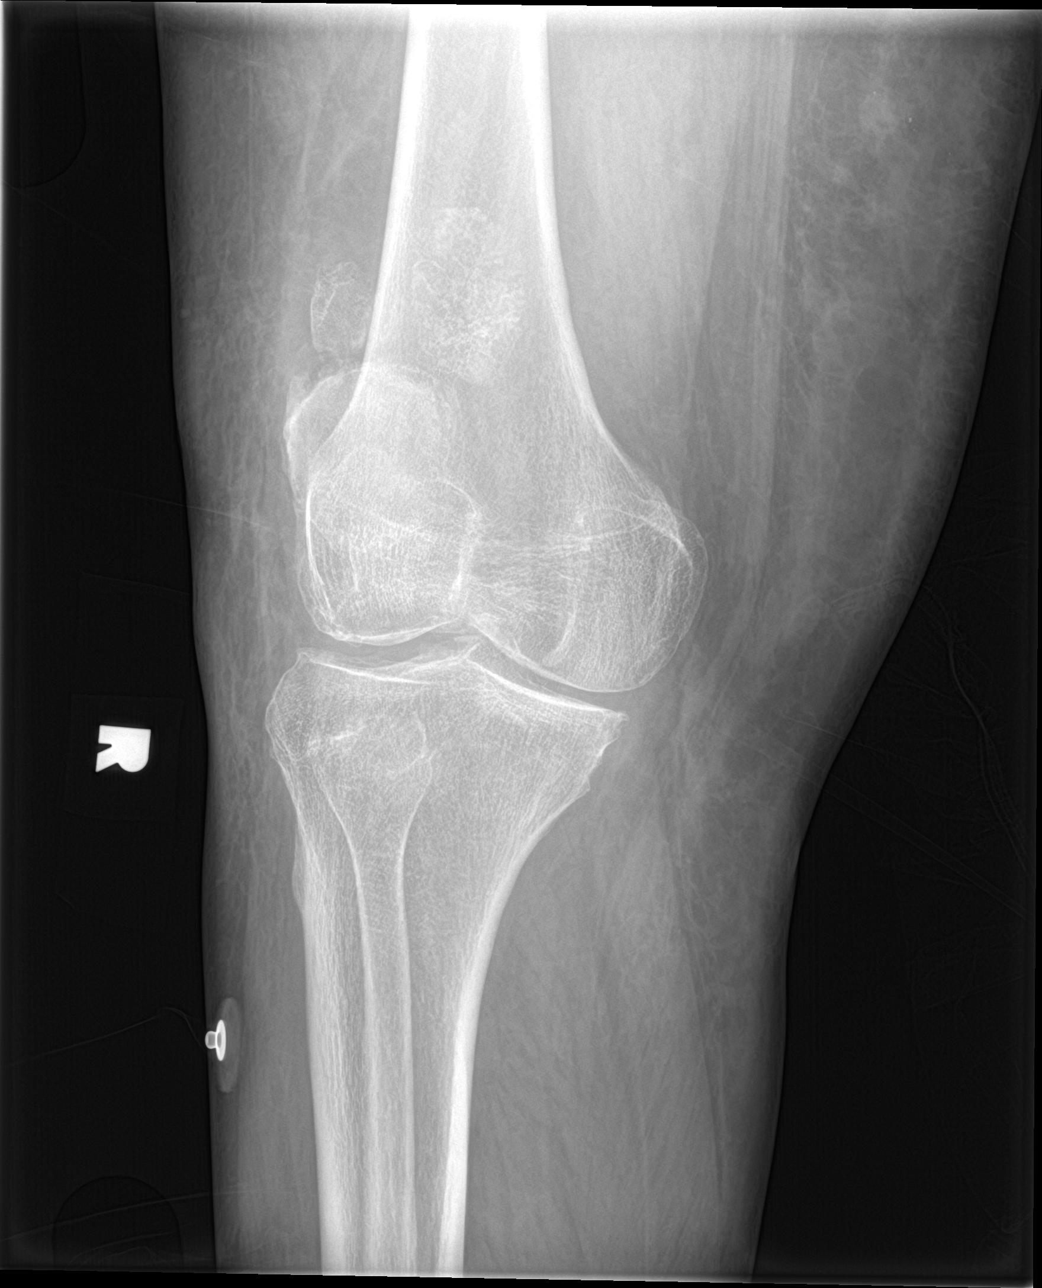

[4 of 4 positions shown; findings below may reference images not displayed]

FINDINGS: Generalized osteopenia. No acute fracture or dislocation. Large
loose body in the suprapatellar joint space. Moderate osteoarthritis
of the patellofemoral compartment. Mild osteoarthritis of the medial
femorotibial compartment. Tricompartmental small marginal
osteophytes. Small loose body near the tibial eminence. No
significant joint effusion. No aggressive osseous lesion. Soft
tissues are normal.
IMPRESSION: 1.  No acute osseous injury of the right knee.
2. Moderate osteoarthritis of the patellofemoral compartment with a
large loose body in the suprapatellar joint space. Mild
osteoarthritis of the medial femorotibial compartment.
